# Patient Record
Sex: Male | Born: 1975 | Race: White | Hispanic: No | Marital: Married | Smoking: Never smoker
Health system: Southern US, Community
[De-identification: ages and names within clinical notes are randomized; demographics above are authoritative.]

## PROBLEM LIST (undated history)

## (undated) DIAGNOSIS — E7800 Pure hypercholesterolemia, unspecified: Secondary | ICD-10-CM

## (undated) DIAGNOSIS — J45909 Unspecified asthma, uncomplicated: Secondary | ICD-10-CM

## (undated) HISTORY — PX: WRIST FUSION: SHX839

## (undated) HISTORY — PX: ANKLE FRACTURE SURGERY: SHX122

## (undated) HISTORY — PX: RHINOPLASTY: SUR1284

---

## 2005-05-23 ENCOUNTER — Emergency Department (HOSPITAL_COMMUNITY): Admission: EM | Admit: 2005-05-23 | Discharge: 2005-05-23 | Payer: Self-pay | Admitting: Emergency Medicine

## 2010-03-01 ENCOUNTER — Ambulatory Visit: Payer: Self-pay | Admitting: Interventional Radiology

## 2010-03-01 ENCOUNTER — Emergency Department (HOSPITAL_BASED_OUTPATIENT_CLINIC_OR_DEPARTMENT_OTHER): Admission: EM | Admit: 2010-03-01 | Discharge: 2010-03-01 | Payer: Self-pay | Admitting: Emergency Medicine

## 2010-10-30 ENCOUNTER — Emergency Department (INDEPENDENT_AMBULATORY_CARE_PROVIDER_SITE_OTHER): Payer: BC Managed Care – PPO

## 2010-10-30 ENCOUNTER — Emergency Department (HOSPITAL_BASED_OUTPATIENT_CLINIC_OR_DEPARTMENT_OTHER)
Admission: EM | Admit: 2010-10-30 | Discharge: 2010-10-31 | Disposition: A | Discharge status: Home / Self Care | Payer: BC Managed Care – PPO | Attending: Emergency Medicine | Admitting: Emergency Medicine

## 2010-10-30 ENCOUNTER — Encounter: Payer: Self-pay | Admitting: Student

## 2010-10-30 DIAGNOSIS — S93409A Sprain of unspecified ligament of unspecified ankle, initial encounter: Secondary | ICD-10-CM | POA: Insufficient documentation

## 2010-10-30 DIAGNOSIS — E7800 Pure hypercholesterolemia, unspecified: Secondary | ICD-10-CM | POA: Insufficient documentation

## 2010-10-30 DIAGNOSIS — W19XXXA Unspecified fall, initial encounter: Secondary | ICD-10-CM | POA: Insufficient documentation

## 2010-10-30 DIAGNOSIS — M79609 Pain in unspecified limb: Secondary | ICD-10-CM

## 2010-10-30 DIAGNOSIS — Y9239 Other specified sports and athletic area as the place of occurrence of the external cause: Secondary | ICD-10-CM | POA: Insufficient documentation

## 2010-10-30 DIAGNOSIS — Y9366 Activity, soccer: Secondary | ICD-10-CM | POA: Insufficient documentation

## 2010-10-30 DIAGNOSIS — M25579 Pain in unspecified ankle and joints of unspecified foot: Secondary | ICD-10-CM

## 2010-10-30 HISTORY — DX: Pure hypercholesterolemia, unspecified: E78.00

## 2010-10-30 NOTE — ED Notes (Signed)
Pt in with c/o right foot and ankle pain x 1 week s/p soccer injury where he broke left arm. Area shows signs of swelling and dark purple/pink bruising. Pt able to ambulate and bear weight with pain. Moves digits well.

## 2010-10-30 NOTE — ED Provider Notes (Signed)
History     Chief Complaint  Patient presents with  . Foot Pain    Right Foot and ankle   Patient is a 35 y.o. male presenting with lower extremity pain. The history is provided by the patient (Was seen by [facility] Orthopedist after fall playing soccer.  L wrist in spling.  Now having R ankle pain and burising.  Is on Vicodin which is helping. ).  Foot Pain The current episode started more than 2 days ago. The problem occurs constantly. The problem has been gradually improving. Pertinent negatives include no chest pain, no abdominal pain, no headaches and no shortness of breath. The symptoms are aggravated by walking. The symptoms are relieved by rest. The treatment provided moderate relief.    Past Medical History  Diagnosis Date  . Hypercholesteremia     Past Surgical History  Procedure Date  . Rhinoplasty     No family history on file.  History  Substance Use Topics  . Smoking status: Never Smoker   . Smokeless tobacco: Never Used  . Alcohol Use: Yes      Review of Systems  Respiratory: Negative for shortness of breath.   Cardiovascular: Negative for chest pain.  Gastrointestinal: Negative for abdominal pain.  Neurological: Negative for headaches.  All other systems reviewed and are negative.    Physical Exam  BP 141/92  Pulse 85  Temp(Src) 98.2 F (36.8 C) (Oral)  Resp 20  SpO2 98%  Physical Exam  Constitutional: He is oriented to person, place, and time. He appears well-developed and well-nourished.  HENT:  Head: Normocephalic and atraumatic.  Eyes: Conjunctivae and EOM are normal. Pupils are equal, round, and reactive to light.  Neck: Neck supple.  Cardiovascular: Normal rate and regular rhythm.  Exam reveals no gallop and no friction rub.   No murmur heard. Pulmonary/Chest: Breath sounds normal. He has no wheezes. He has no rales. He exhibits no tenderness.  Abdominal: Soft. Bowel sounds are normal. He exhibits no distension. There is no tenderness. There  is no rebound and no guarding.  Musculoskeletal: Normal range of motion. He exhibits tenderness.       Bruising and tenderness over proximal R ankle.  No deformity.  Pulses intact.  No proximal tibia tenderness.   Neurological: He is alert and oriented to person, place, and time. A cranial nerve deficit is present.  Skin: Skin is warm and dry. No rash noted.  Psychiatric: He has a normal mood and affect.    ED Course  Procedures  MDM No results found for this or any previous visit. Dg Ankle Complete Right  10/30/2010  *RADIOLOGY REPORT*  Clinical Data: Pain post soccer  RIGHT ANKLE - COMPLETE 3+ VIEW  Comparison: None.  Findings: Ankle mortise intact. Negative for fracture, dislocation, or other acute abnormality.  Normal alignment and mineralization. No significant degenerative change.  Regional soft tissues unremarkable.  IMPRESSION:  Negative  Original Report Authenticated By: Osa Craver, M.D.   Dg Foot Complete Right  10/30/2010  *RADIOLOGY REPORT*  Clinical Data: Pain after soccer  RIGHT FOOT COMPLETE - 3+ VIEW  Comparison: None.  Findings: Negative for fracture, dislocation, or other acute abnormality.  Normal alignment and mineralization. No significant degenerative change.  Regional soft tissues unremarkable.  IMPRESSION:  Negative  Original Report Authenticated By: Osa Craver, M.D.    Pt is seen and examined;  Initial history and physical completed.  Will follow.      Pt will continue RICE.  Splint applied for sprain or possible occult FX.  Has ORTHO doc in [place].  DC home in stable condition.   Gwynneth Fabio A. Patrica Duel, MD 10/30/10 [phone]

## 2011-11-09 ENCOUNTER — Encounter (HOSPITAL_BASED_OUTPATIENT_CLINIC_OR_DEPARTMENT_OTHER): Payer: Self-pay | Admitting: *Deleted

## 2011-11-09 ENCOUNTER — Emergency Department (HOSPITAL_BASED_OUTPATIENT_CLINIC_OR_DEPARTMENT_OTHER)
Admission: EM | Admit: 2011-11-09 | Discharge: 2011-11-09 | Disposition: A | Discharge status: Home / Self Care | Payer: BC Managed Care – PPO | Attending: Emergency Medicine | Admitting: Emergency Medicine

## 2011-11-09 DIAGNOSIS — M795 Residual foreign body in soft tissue: Secondary | ICD-10-CM

## 2011-11-09 DIAGNOSIS — Z23 Encounter for immunization: Secondary | ICD-10-CM | POA: Insufficient documentation

## 2011-11-09 DIAGNOSIS — T169XXA Foreign body in ear, unspecified ear, initial encounter: Secondary | ICD-10-CM | POA: Insufficient documentation

## 2011-11-09 DIAGNOSIS — IMO0002 Reserved for concepts with insufficient information to code with codable children: Secondary | ICD-10-CM | POA: Insufficient documentation

## 2011-11-09 MED ORDER — TETANUS-DIPHTH-ACELL PERTUSSIS 5-2.5-18.5 LF-MCG/0.5 IM SUSP
0.5000 mL | Freq: Once | INTRAMUSCULAR | Status: AC
  Administered 2011-11-09: 0.5 mL via INTRAMUSCULAR
  Filled 2011-11-09: qty 0.5

## 2011-11-09 NOTE — ED Provider Notes (Signed)
History     CSN: [id number]  Arrival date & time 11/09/11  2203   First MD Initiated Contact with Patient 11/09/11 2328      Chief Complaint  Patient presents with  . Foreign Body in Ear    (Consider location/radiation/quality/duration/timing/severity/associated sxs/prior treatment) HPI Comments: Patient reports playing soccer earlier this evening when a moth flew in his ear and he could not get it out. He reports feeling the moth flutter every once in a while. He denies ear pain, tinnitus, headache, ear discharge, and hearing loss.   Patient is a 36 y.o. male presenting with foreign body in ear.  Foreign Body in Ear Pertinent negatives include no headaches.    Past Medical History  Diagnosis Date  . Hypercholesteremia     Past Surgical History  Procedure Date  . Rhinoplasty     No family history on file.  History  Substance Use Topics  . Smoking status: Never Smoker   . Smokeless tobacco: Never Used  . Alcohol Use: Yes      Review of Systems  HENT: Negative for hearing loss, ear pain, tinnitus and ear discharge.   Eyes: Negative for visual disturbance.  Neurological: Negative for dizziness, light-headedness and headaches.    Allergies  Crab  Home Medications   Current Outpatient Rx  Name Route Sig Dispense Refill  . EPINEPHRINE 0.3 MG/0.3ML IJ DEVI Intramuscular Inject 0.3 mg into the muscle once.    Marland Kitchen HYDROCODONE-ACETAMINOPHEN 5-500 MG PO TABS Oral Take 1 tablet by mouth 2 (two) times daily as needed. For pain     . MELOXICAM 15 MG PO TABS Oral Take 15 mg by mouth daily.    Marland Kitchen PHENTERMINE HCL 37.5 MG PO TABS Oral Take 37.5 mg by mouth daily before breakfast.    . SIMVASTATIN 20 MG PO TABS Oral Take 20 mg by mouth every evening.      BP 169/93  Pulse 102  Temp 98.5 F (36.9 C) (Oral)  Resp 18  Ht 5\' 10"  (1.778 m)  Wt 238 lb (107.956 kg)  BMI 34.15 kg/m2  SpO2 96%  Physical Exam  Constitutional: He appears well-developed and well-nourished. No  distress.  HENT:  Head: Normocephalic and atraumatic.       Visualization of left external ear canal shows an insect that is not moving.   Eyes: Conjunctivae are normal. No scleral icterus.  Neck: Normal range of motion.  Cardiovascular: Normal rate and regular rhythm.   Pulmonary/Chest: Effort normal.  Musculoskeletal: Normal range of motion.  Neurological: He is alert.  Skin: Skin is warm and dry. He is not diaphoretic.  Psychiatric: He has a normal mood and affect. His behavior is normal.    ED Course  FOREIGN BODY REMOVAL Date/Time: 11/09/2011 11:42 PM Performed by: Emilia Beck Authorized by: Emilia Beck Consent: Verbal consent obtained. Written consent not obtained. Risks and benefits: risks, benefits and alternatives were discussed Consent given by: patient Patient understanding: patient states understanding of the procedure being performed Patient consent: the patient's understanding of the procedure matches consent given Procedure consent: procedure consent matches procedure scheduled Relevant documents: relevant documents present and verified Test results: test results available and properly labeled Site marked: the operative site was marked Imaging studies: imaging studies available Required items: required blood products, implants, devices, and special equipment available Patient identity confirmed: verbally with patient Body area: ear Location details: left ear Patient sedated: no Patient restrained: no Patient cooperative: yes Localization method: visualized Removal mechanism: alligator forceps Complexity:  simple 1 objects recovered. Objects recovered: insect Post-procedure assessment: foreign body removed Patient tolerance: Patient tolerated the procedure well with no immediate complications. Comments: Insect visualized in the patient's left ear, removed with alligator forceps. I visualized an intact TM after the procedure as well as a clear  external left ear canal.    (including critical care time)  Labs Reviewed - No data to display No results found.   No diagnosis found.    MDM  11:45 PM Insect removed from patient's left external ear canal without complications. I visualized an intact left TM and clear external ear canal following the procedure. Patient will be given Tdap because he cannot remember his last Tetanus vaccine. No further evaluation at this time. Patient will be discharged. Plan discussed with Dr. Nicanor Alcon who is agreeable.        Emilia Beck, PA-C 11/09/11 [phone]

## 2011-11-09 NOTE — ED Notes (Signed)
Was playing soccer when an insect flew into his left ear. He can feel it moving.

## 2011-11-10 NOTE — ED Provider Notes (Signed)
Medical screening examination/treatment/procedure(s) were performed by non-physician practitioner and as supervising physician I was immediately available for consultation/collaboration.  Jasmine Awe, MD 11/10/11 [phone]

## 2012-08-10 ENCOUNTER — Emergency Department (HOSPITAL_BASED_OUTPATIENT_CLINIC_OR_DEPARTMENT_OTHER): Payer: BC Managed Care – PPO

## 2012-08-10 ENCOUNTER — Encounter (HOSPITAL_BASED_OUTPATIENT_CLINIC_OR_DEPARTMENT_OTHER): Payer: Self-pay | Admitting: *Deleted

## 2012-08-10 ENCOUNTER — Emergency Department (HOSPITAL_BASED_OUTPATIENT_CLINIC_OR_DEPARTMENT_OTHER)
Admission: EM | Admit: 2012-08-10 | Discharge: 2012-08-10 | Disposition: A | Discharge status: Home / Self Care | Payer: BC Managed Care – PPO | Attending: Emergency Medicine | Admitting: Emergency Medicine

## 2012-08-10 DIAGNOSIS — E7800 Pure hypercholesterolemia, unspecified: Secondary | ICD-10-CM | POA: Insufficient documentation

## 2012-08-10 DIAGNOSIS — S63509A Unspecified sprain of unspecified wrist, initial encounter: Secondary | ICD-10-CM | POA: Insufficient documentation

## 2012-08-10 DIAGNOSIS — S63501A Unspecified sprain of right wrist, initial encounter: Secondary | ICD-10-CM

## 2012-08-10 DIAGNOSIS — Y9239 Other specified sports and athletic area as the place of occurrence of the external cause: Secondary | ICD-10-CM | POA: Insufficient documentation

## 2012-08-10 DIAGNOSIS — Z79899 Other long term (current) drug therapy: Secondary | ICD-10-CM | POA: Insufficient documentation

## 2012-08-10 DIAGNOSIS — Y9366 Activity, soccer: Secondary | ICD-10-CM | POA: Insufficient documentation

## 2012-08-10 DIAGNOSIS — Y92838 Other recreation area as the place of occurrence of the external cause: Secondary | ICD-10-CM | POA: Insufficient documentation

## 2012-08-10 DIAGNOSIS — X500XXA Overexertion from strenuous movement or load, initial encounter: Secondary | ICD-10-CM | POA: Insufficient documentation

## 2012-08-10 DIAGNOSIS — Z791 Long term (current) use of non-steroidal anti-inflammatories (NSAID): Secondary | ICD-10-CM | POA: Insufficient documentation

## 2012-08-10 NOTE — ED Provider Notes (Signed)
History     CSN: [id number]  Arrival date & time 08/10/12  2156   First MD Initiated Contact with Patient 08/10/12 2239      Chief Complaint  Patient presents with  . Wrist Injury    (Consider location/radiation/quality/duration/timing/severity/associated sxs/prior treatment) Patient is a 37 y.o. male presenting with wrist injury. The history is provided by the patient.  Wrist Injury Location:  Wrist Injury: yes   Mechanism of injury comment:  Bent backward blocking soccer kick Wrist location:  R wrist Pain details:    Quality:  Aching   Radiates to:  Does not radiate   Severity:  Moderate   Onset quality:  Sudden   Timing:  Constant   Progression:  Unchanged Chronicity:  New Dislocation: no   Prior injury to area:  No Relieved by:  Nothing Worsened by:  Movement Ineffective treatments:  None tried   Past Medical History  Diagnosis Date  . Hypercholesteremia     Past Surgical History  Procedure Laterality Date  . Rhinoplasty      History reviewed. No pertinent family history.  History  Substance Use Topics  . Smoking status: Never Smoker   . Smokeless tobacco: Never Used  . Alcohol Use: No      Review of Systems  All other systems reviewed and are negative.    Allergies  Crab and Bee pollen  Home Medications   Current Outpatient Rx  Name  Route  Sig  Dispense  Refill  . EPINEPHrine (EPIPEN) 0.3 mg/0.3 mL DEVI   Intramuscular   Inject 0.3 mg into the muscle once.         Marland Kitchen HYDROcodone-acetaminophen (VICODIN) 5-500 MG per tablet   Oral   Take 1 tablet by mouth 2 (two) times daily as needed. For pain          . meloxicam (MOBIC) 15 MG tablet   Oral   Take 15 mg by mouth daily.         . phentermine (ADIPEX-P) 37.5 MG tablet   Oral   Take 37.5 mg by mouth daily before breakfast.         . simvastatin (ZOCOR) 20 MG tablet   Oral   Take 20 mg by mouth every evening.           BP 144/89  Pulse 70  Temp(Src) 98.5 F (36.9  C) (Oral)  Resp 18  Ht 5\' 10"  (1.778 m)  Wt 215 lb (97.523 kg)  BMI 30.85 kg/m2  SpO2 98%  Physical Exam  Nursing note and vitals reviewed. Constitutional: He is oriented to person, place, and time. He appears well-developed and well-nourished. No distress.  HENT:  Head: Normocephalic and atraumatic.  Neck: Normal range of motion. Neck supple.  Musculoskeletal: Normal range of motion.  The right wrist is noted to have ttp, mild swelling over the lateral aspect of the wrist.  The distal motor, sensory, and cap refill are intact.  Neurological: He is alert and oriented to person, place, and time.  Skin: Skin is warm and dry. He is not diaphoretic.    ED Course  Procedures (including critical care time)  Labs Reviewed - No data to display No results found.   No diagnosis found.    MDM  Will treat with rest, nsaids.  Follow up prn.          Geoffery Lyons, MD 08/10/12 2255

## 2012-08-10 NOTE — ED Notes (Signed)
Pt c/o right wrist and hand injury x 2 hrs ago while playing soccer

## 2012-09-11 ENCOUNTER — Emergency Department (HOSPITAL_BASED_OUTPATIENT_CLINIC_OR_DEPARTMENT_OTHER)
Admission: EM | Admit: 2012-09-11 | Discharge: 2012-09-11 | Disposition: A | Discharge status: Home / Self Care | Payer: BC Managed Care – PPO | Attending: Emergency Medicine | Admitting: Emergency Medicine

## 2012-09-11 ENCOUNTER — Encounter (HOSPITAL_BASED_OUTPATIENT_CLINIC_OR_DEPARTMENT_OTHER): Payer: Self-pay | Admitting: Emergency Medicine

## 2012-09-11 ENCOUNTER — Emergency Department (HOSPITAL_BASED_OUTPATIENT_CLINIC_OR_DEPARTMENT_OTHER): Payer: BC Managed Care – PPO

## 2012-09-11 DIAGNOSIS — N419 Inflammatory disease of prostate, unspecified: Secondary | ICD-10-CM | POA: Insufficient documentation

## 2012-09-11 DIAGNOSIS — Z8639 Personal history of other endocrine, nutritional and metabolic disease: Secondary | ICD-10-CM | POA: Insufficient documentation

## 2012-09-11 DIAGNOSIS — R21 Rash and other nonspecific skin eruption: Secondary | ICD-10-CM | POA: Insufficient documentation

## 2012-09-11 DIAGNOSIS — Z862 Personal history of diseases of the blood and blood-forming organs and certain disorders involving the immune mechanism: Secondary | ICD-10-CM | POA: Insufficient documentation

## 2012-09-11 DIAGNOSIS — R509 Fever, unspecified: Secondary | ICD-10-CM | POA: Insufficient documentation

## 2012-09-11 DIAGNOSIS — M549 Dorsalgia, unspecified: Secondary | ICD-10-CM | POA: Insufficient documentation

## 2012-09-11 HISTORY — DX: Unspecified asthma, uncomplicated: J45.909

## 2012-09-11 LAB — CBC WITH DIFFERENTIAL/PLATELET
Eosinophils Absolute: 0.2 10*3/uL (ref 0.0–0.7)
Eosinophils Relative: 2 % (ref 0–5)
Hemoglobin: 15.2 g/dL (ref 13.0–17.0)
Lymphs Abs: 3 10*3/uL (ref 0.7–4.0)
MCH: 30.2 pg (ref 26.0–34.0)
MCV: 85.1 fL (ref 78.0–100.0)
Monocytes Absolute: 1 10*3/uL (ref 0.1–1.0)
Monocytes Relative: 10 % (ref 3–12)
RBC: 5.03 MIL/uL (ref 4.22–5.81)

## 2012-09-11 LAB — URINALYSIS, ROUTINE W REFLEX MICROSCOPIC
Bilirubin Urine: NEGATIVE
Hgb urine dipstick: NEGATIVE
Ketones, ur: NEGATIVE mg/dL
Specific Gravity, Urine: 1.029 (ref 1.005–1.030)
pH: 5.5 (ref 5.0–8.0)

## 2012-09-11 LAB — LIPASE, BLOOD: Lipase: 41 U/L (ref 11–59)

## 2012-09-11 LAB — COMPREHENSIVE METABOLIC PANEL
Alkaline Phosphatase: 73 U/L (ref 39–117)
BUN: 17 mg/dL (ref 6–23)
Calcium: 9.3 mg/dL (ref 8.4–10.5)
Creatinine, Ser: 0.8 mg/dL (ref 0.50–1.35)
GFR calc Af Amer: >90 mL/min (ref >90)
Glucose, Bld: 98 mg/dL (ref 70–99)
Total Protein: 7 g/dL (ref 6.0–8.3)

## 2012-09-11 MED ORDER — HYDROCODONE-ACETAMINOPHEN 5-325 MG PO TABS: 1.0000 | ORAL_TABLET | ORAL | Status: DC | PRN

## 2012-09-11 MED ORDER — IOHEXOL 300 MG/ML  SOLN
50.0000 mL | Freq: Once | INTRAMUSCULAR | Status: AC | PRN
  Administered 2012-09-11: 100 mL via INTRAVENOUS

## 2012-09-11 MED ORDER — IOHEXOL 300 MG/ML  SOLN
50.0000 mL | Freq: Once | INTRAMUSCULAR | Status: AC | PRN
  Administered 2012-09-11: 50 mL via ORAL

## 2012-09-11 MED ORDER — CEFDINIR 300 MG PO CAPS: 300.0000 mg | ORAL_CAPSULE | Freq: Two times a day (BID) | ORAL | Status: DC

## 2012-09-11 MED ORDER — DEXTROSE 5 % IV SOLN
1.0000 g | Freq: Once | INTRAVENOUS | Status: AC
  Administered 2012-09-11: 1 g via INTRAVENOUS
  Filled 2012-09-11: qty 10

## 2012-09-11 NOTE — ED Provider Notes (Addendum)
History  This chart was scribed for Gilda Crease, MD by Ardelia Mems, ED Scribe. This patient was seen in room [facility]11/[facility]11 and the patient's care was started at 6:33 PM.   CSN: [id number]  Arrival date & time 09/11/12  1723     Chief Complaint  Patient presents with  . Flank Pain  . Abdominal Pain     Patient is a 37 y.o. male presenting with abdominal pain. The history is provided by the patient. No language interpreter was used.  Abdominal Pain Associated symptoms include abdominal pain.    HPI Comments: Noah Rice is a 37 y.o. male who presents to the Emergency Department complaining of sudden onset, sharp right flank pain, which radiates to his back onset yesterday. Pt states that he began having constant, moderate RLQ abdominal pain 1 hour ago after a prostate exam. Pt states that he went to see his MD today with a fever of 102F in addition to his flank pain, and that when his abdominal pain onset suddenly, pt was sent here to rule out appendicitis.Triage Temp is 98.79F. Pt's RLQ abdominal pain is worse with palpation. Pt states that he has also has a rash on LLQ for 6 days, that is not spreading. Pt denies chills, diarrhea, vomiting or any other symptoms.   Past Medical History  Diagnosis Date  . Hypercholesteremia     Past Surgical History  Procedure Laterality Date  . Rhinoplasty      No family history on file.  History  Substance Use Topics  . Smoking status: Never Smoker   . Smokeless tobacco: Never Used  . Alcohol Use: No      Review of Systems  Constitutional: Positive for fever. Negative for chills.  HENT: Negative for neck pain.   Gastrointestinal: Positive for abdominal pain. Negative for nausea, vomiting and diarrhea.       Flank pain.  Musculoskeletal: Positive for back pain.  Skin: Positive for rash.   A complete 10 system review of systems was obtained and all systems are negative except as noted in the HPI and PMH.   Allergies  Crab  and Bee pollen  Home Medications   Current Outpatient Rx  Name  Route  Sig  Dispense  Refill  . EPINEPHrine (EPIPEN) 0.3 mg/0.3 mL DEVI   Intramuscular   Inject 0.3 mg into the muscle once.         . phentermine (ADIPEX-P) 37.5 MG tablet   Oral   Take 37.5 mg by mouth daily before breakfast.           Triage Vitals: BP 135/83  Pulse 68  Temp(Src) 98.8 F (37.1 C) (Oral)  Resp 16  Ht 5\' 10"  (1.778 m)  Wt 217 lb (98.431 kg)  BMI 31.14 kg/m2  SpO2 98%  Physical Exam  Constitutional: He is oriented to person, place, and time. He appears well-developed and well-nourished. No distress.  HENT:  Head: Normocephalic and atraumatic.  Right Ear: Hearing normal.  Left Ear: Hearing normal.  Nose: Nose normal.  Mouth/Throat: Oropharynx is clear and moist and mucous membranes are normal.  Eyes: Conjunctivae and EOM are normal. Pupils are equal, round, and reactive to light.  Neck: Normal range of motion. Neck supple.  Cardiovascular: Regular rhythm, S1 normal and S2 normal.  Exam reveals no gallop and no friction rub.   No murmur heard. Pulmonary/Chest: Effort normal and breath sounds normal. No respiratory distress. He exhibits no tenderness.  Abdominal: Soft. Normal appearance and bowel sounds  are normal. There is no hepatosplenomegaly. There is tenderness in the right lower quadrant. There is no rebound, no guarding, no tenderness at McBurney's point and negative Murphy's sign. No hernia.  Musculoskeletal: Normal range of motion.  Neurological: He is alert and oriented to person, place, and time. He has normal strength. No cranial nerve deficit or sensory deficit. Coordination normal. GCS eye subscore is 4. GCS verbal subscore is 5. GCS motor subscore is 6.  Skin: Skin is warm, dry and intact. No rash noted. No cyanosis.  Psychiatric: He has a normal mood and affect. His speech is normal and behavior is normal. Thought content normal.    ED Course  Procedures (including  critical care time)  DIAGNOSTIC STUDIES: Oxygen Saturation is 98% on RA, normal by my interpretation.    COORDINATION OF CARE: 6:36 PM- Pt advised of plan for treatment and pt agrees.  Medications  iohexol (OMNIPAQUE) 300 MG/ML solution 50 mL (not administered)  iohexol (OMNIPAQUE) 300 MG/ML solution 50 mL (50 mLs Oral Contrast Given 09/11/12 1930)     Labs Reviewed  COMPREHENSIVE METABOLIC PANEL - Abnormal; Notable for the following:    Total Bilirubin 0.2 (*)    All other components within normal limits  URINALYSIS, ROUTINE W REFLEX MICROSCOPIC  CBC WITH DIFFERENTIAL  LIPASE, BLOOD   Ct Abdomen Pelvis W Contrast  09/11/2012   *RADIOLOGY REPORT*  Clinical Data: Appendicitis.  Abdominal pain.  Right lower quadrant pain.  CT ABDOMEN AND PELVIS WITH CONTRAST  Technique:  Multidetector CT imaging of the abdomen and pelvis was performed following the standard protocol during bolus administration of intravenous contrast.  Contrast: OMNIPAQUE IOHEXOL 300 MG/ML  SOLN  Comparison: None.  Findings: Lung Bases: Atelectasis at the bases.  Suggestion of centrilobular emphysema.  Liver:  Normal.  Spleen:  Normal.  Gallbladder:  Normal.  Common bile duct:  Normal.  Pancreas:  Normal.  Adrenal glands:  Normal bilaterally.  Kidneys:  Normal enhancement.  Both ureters are normal.  No calculi.  Stomach:  Normal.  Small bowel:  Normal.  No inflammatory changes of mesenteric adenopathy.  Colon:   Normal appendix.  Remainder of the colon is normal.  Pelvic Genitourinary:  Urinary bladder normal.  No free fluid.  No adenopathy.  Bones:  Small calcified protrusion at L5-S1.  Vasculature: Normal.  Body Wall: Normal.  IMPRESSION: Negative for appendicitis.  No acute abnormality.   Original Report Authenticated By: Andreas Newport, M.D.     Diagnosis: Prostatitis    MDM  Patient sent to the ER for evaluation of possible appendicitis. Patient has been experiencing fever and right lower abdominal pain. Patient  has been experiencing a fever for several days. Was seen by his doctor earlier and it was felt that he had an enlarged tender prostate, possibly prostate infection. After the prostate exam, however, they started having pain in the right lower quadrant and he was told to come to the ER to get a CAT scan to rule out appendicitis. CAT scan has been performed and is negative. Based on the history of pain prostate exam, concern for prostatitis is reasonable. Patient is leaving the country tomorrow therefore will be aggressively treated with Rocephin here and prescribed Omnicef.      I personally performed the services described in this documentation, which was scribed in my presence. The recorded information has been reviewed and is accurate.   Gilda Crease, MD 09/11/12 [id number]  Gilda Crease, MD 09/11/12 2056

## 2012-09-11 NOTE — ED Notes (Signed)
rx x 2 for omnicef and hydrocodone- d/c with ride

## 2012-09-11 NOTE — ED Notes (Signed)
Pt having right flank pain, radiating to back.  Also having RLQ abdominal pain.  Pt relates he saw his MD today.  Ran blood work and was sent here for evaluation to rule out appendix.

## 2012-12-11 ENCOUNTER — Encounter (HOSPITAL_BASED_OUTPATIENT_CLINIC_OR_DEPARTMENT_OTHER): Payer: Self-pay | Admitting: Emergency Medicine

## 2012-12-11 ENCOUNTER — Emergency Department (HOSPITAL_BASED_OUTPATIENT_CLINIC_OR_DEPARTMENT_OTHER): Payer: BC Managed Care – PPO

## 2012-12-11 ENCOUNTER — Emergency Department (HOSPITAL_BASED_OUTPATIENT_CLINIC_OR_DEPARTMENT_OTHER)
Admission: EM | Admit: 2012-12-11 | Discharge: 2012-12-11 | Disposition: A | Discharge status: Other Acute Inpatient Hospital | Payer: BC Managed Care – PPO | Attending: Emergency Medicine | Admitting: Emergency Medicine

## 2012-12-11 DIAGNOSIS — I200 Unstable angina: Secondary | ICD-10-CM

## 2012-12-11 DIAGNOSIS — Z862 Personal history of diseases of the blood and blood-forming organs and certain disorders involving the immune mechanism: Secondary | ICD-10-CM | POA: Insufficient documentation

## 2012-12-11 DIAGNOSIS — Z8639 Personal history of other endocrine, nutritional and metabolic disease: Secondary | ICD-10-CM | POA: Insufficient documentation

## 2012-12-11 DIAGNOSIS — J45909 Unspecified asthma, uncomplicated: Secondary | ICD-10-CM | POA: Insufficient documentation

## 2012-12-11 DIAGNOSIS — R209 Unspecified disturbances of skin sensation: Secondary | ICD-10-CM | POA: Insufficient documentation

## 2012-12-11 DIAGNOSIS — I209 Angina pectoris, unspecified: Secondary | ICD-10-CM | POA: Insufficient documentation

## 2012-12-11 DIAGNOSIS — Z79899 Other long term (current) drug therapy: Secondary | ICD-10-CM | POA: Insufficient documentation

## 2012-12-11 LAB — CBC WITH DIFFERENTIAL/PLATELET
Basophils Relative: 0 % (ref 0–1)
HCT: 45.1 % (ref 39.0–52.0)
Hemoglobin: 15.5 g/dL (ref 13.0–17.0)
Lymphs Abs: 3.1 10*3/uL (ref 0.7–4.0)
MCH: 29.7 pg (ref 26.0–34.0)
MCHC: 34.4 g/dL (ref 30.0–36.0)
Monocytes Absolute: 1 10*3/uL (ref 0.1–1.0)
Monocytes Relative: 10 % (ref 3–12)
Neutro Abs: 5.9 10*3/uL (ref 1.7–7.7)
RBC: 5.22 MIL/uL (ref 4.22–5.81)

## 2012-12-11 LAB — BASIC METABOLIC PANEL
BUN: 17 mg/dL (ref 6–23)
Chloride: 100 mEq/L (ref 96–112)
GFR calc Af Amer: >90 mL/min (ref >90)
Glucose, Bld: 97 mg/dL (ref 70–99)
Potassium: 3.9 mEq/L (ref 3.5–5.1)

## 2012-12-11 MED ORDER — SODIUM CHLORIDE 0.9 % IV SOLN
Freq: Once | INTRAVENOUS | Status: AC
  Administered 2012-12-11: 20 mL/h via INTRAVENOUS

## 2012-12-11 MED ORDER — ASPIRIN 81 MG PO CHEW
324.0000 mg | CHEWABLE_TABLET | Freq: Once | ORAL | Status: AC
  Administered 2012-12-11: 324 mg via ORAL
  Filled 2012-12-11: qty 3
  Filled 2012-12-11: qty 1

## 2012-12-11 MED ORDER — NITROGLYCERIN 0.4 MG SL SUBL
0.4000 mg | SUBLINGUAL_TABLET | SUBLINGUAL | Status: DC | PRN
  Filled 2012-12-11: qty 25

## 2012-12-11 MED ORDER — ENOXAPARIN SODIUM 100 MG/ML ~~LOC~~ SOLN
1.0000 mg/kg | Freq: Once | SUBCUTANEOUS | Status: AC
  Administered 2012-12-11: 100 mg via SUBCUTANEOUS
  Filled 2012-12-11: qty 1

## 2012-12-11 MED ORDER — NITROGLYCERIN 2 % TD OINT
TOPICAL_OINTMENT | TRANSDERMAL | Status: AC
  Filled 2012-12-11: qty 1

## 2012-12-11 MED ORDER — NITROGLYCERIN IN D5W 200-5 MCG/ML-% IV SOLN
2.0000 ug/min | INTRAVENOUS | Status: DC
  Administered 2012-12-11: 10 ug/min via INTRAVENOUS
  Filled 2012-12-11: qty 250

## 2012-12-11 MED ORDER — NITROGLYCERIN 2 % TD OINT
1.0000 [in_us] | TOPICAL_OINTMENT | Freq: Once | TRANSDERMAL | Status: AC
  Administered 2012-12-11: 1 [in_us] via TOPICAL

## 2012-12-11 NOTE — ED Notes (Signed)
Pt verbalizes no prior history of HTN

## 2012-12-11 NOTE — ED Notes (Signed)
Pt having left sided chest pain and left arm pain/numbness since this am.  Pt denies sob but does have some lightheadedness and fatigue.  Ekg done in triage.

## 2012-12-11 NOTE — ED Notes (Signed)
[facility] has assumed care of the patient.

## 2012-12-11 NOTE — ED Notes (Signed)
Note patient and his wife anxious....Marland Kitchenreassurred them, explained plan of care.

## 2012-12-11 NOTE — ED Notes (Signed)
Monitored rhythm is NSR, Rate 77, regular.

## 2012-12-11 NOTE — ED Notes (Signed)
Assigned to bed 727 @ [facility] per nursing supervisor, RN notified, [facility] called for transport.

## 2012-12-11 NOTE — ED Notes (Signed)
BP 139/71.  States 'pain' has resolved at this time, however c/o residual heaviness.  Monitoring closely.

## 2012-12-11 NOTE — ED Notes (Signed)
Bed assignment changed to 464 @ [facility] per nursing supervisor Tammy.

## 2012-12-11 NOTE — ED Provider Notes (Signed)
CSN: [id number]     Arrival date & time 12/11/12  1710 History  This chart was scribed for Doug Sou, MD by Caryn Bee, ED Scribe. This patient was seen in room MH07/MH07 and the patient's care was started 6:26 PM.    Chief Complaint  Patient presents with  . Chest Pain  . Numbness   The history is provided by the patient. No language interpreter was used.   HPI Comments: [place] is a 37 y.o. male who presents to the Emergency Department complaining of intermittent bilateral chest pain that began this morning when he was exercising on the elliptical. He reports the episodes last about 30-45 sec at a time. Pt states that when he woke up this morning he had some intermittent numbness in his left arm. He usually works out about 6 times per week, but this morning he was unable to work out as intensely as usual. He denies SOB currently, but had some about 1 hour ago.  Pt took prescribed androgel this morning. He denies taking any OTC medication for the chest pain. Pt also denies diaphoresis, nausea. Pt denies h/o HTN or DM. He has h/o hypercholesterolemia that was controlled with medication about 3-4 months ago. He denies allergies to any medications. Pt's father had a MI at age of 27 and also had hypercholesterolemia.   Pt's PCP is Dr. Verner Chol. Pt denies smoking but uses alcohol about 2-3 times per week. He does not use illicit drugs. Past Medical History  Diagnosis Date  . Hypercholesteremia   . Asthma    Past Surgical History  Procedure Laterality Date  . Rhinoplasty     History reviewed. No pertinent family history. History  Substance Use Topics  . Smoking status: Never Smoker   . Smokeless tobacco: Never Used  . Alcohol Use: No    Review of Systems  Constitutional: Negative.  Negative for diaphoresis.  HENT: Negative.   Respiratory: Positive for shortness of breath.   Cardiovascular: Positive for chest pain (bilateral chest pain).  Gastrointestinal: Negative.  Negative  for nausea.  Musculoskeletal: Negative.   Skin: Negative.   Neurological: Positive for numbness (intermittent left arm).  Psychiatric/Behavioral: Negative.   All other systems reviewed and are negative.    Allergies  Crab and Bee pollen  Home Medications   Current Outpatient Rx  Name  Route  Sig  Dispense  Refill  . Lorcaserin HCl (BELVIQ) 10 MG TABS   Oral   Take by mouth.         . testosterone (ANDROGEL) 50 MG/5GM GEL   Transdermal   Place 5 g onto the skin daily.         Marland Kitchen EPINEPHrine (EPIPEN) 0.3 mg/0.3 mL DEVI   Intramuscular   Inject 0.3 mg into the muscle once.          BP 169/77  Pulse 73  Temp(Src) 98.4 F (36.9 C) (Oral)  Resp 21  Ht 5\' 11"  (1.803 m)  Wt 225 lb (102.059 kg)  BMI 31.39 kg/m2  SpO2 99% Physical Exam  Nursing note and vitals reviewed. Constitutional: He appears well-developed and well-nourished.  HENT:  Head: Normocephalic and atraumatic.  Eyes: Conjunctivae are normal. Pupils are equal, round, and reactive to light.  Neck: Neck supple. No tracheal deviation present. No thyromegaly present.  Cardiovascular: Normal rate, regular rhythm and normal heart sounds.   No murmur heard. Pulmonary/Chest: Effort normal and breath sounds normal. He has no wheezes. He has no rales.  Abdominal: Soft. Bowel  sounds are normal. He exhibits no distension. There is no tenderness.  Musculoskeletal: Normal range of motion. He exhibits no edema and no tenderness.  Neurological: He is alert. Coordination normal.  Skin: Skin is warm and dry. No rash noted.  Psychiatric: He has a normal mood and affect.    ED Course  Procedures (including critical care time) DIAGNOSTIC STUDIES: Oxygen Saturation is 99% on room air, normal by my interpretation.    COORDINATION OF CARE: 6:34 PM-Discussed treatment plan with pt at bedside and pt agreed to plan.   Labs Review Labs Reviewed  BASIC METABOLIC PANEL  CBC WITH DIFFERENTIAL  TROPONIN I   Imaging  Review Dg Chest Portable 1 View  12/11/2012   *RADIOLOGY REPORT*  Clinical Data: Chest pain and numbness  PORTABLE CHEST - 1 VIEW  Comparison: None.  Findings: The heart size and mediastinal contours are within normal limits.  Both lungs are clear.  The visualized skeletal structures are unremarkable.  IMPRESSION: Negative exam.   Original Report Authenticated By: Signa Kell, M.D.   Nitroglycerin paste was initially placed on the patient. At 7:50 PM he continued to complain of mild discomfort. Intravenous nitroglycerin drip ordered to titrate to pain and Lovenox ordered subcutaneously.  A 40 5 PM patient pain-free after treatment with intravenous nitroglycerin and Lovenox. Chest x-ray viewed by me Results for orders placed during the hospital encounter of 12/11/12  BASIC METABOLIC PANEL      Result Value Range   Sodium 137  135 - 145 mEq/L   Potassium 3.9  3.5 - 5.1 mEq/L   Chloride 100  96 - 112 mEq/L   CO2 27  19 - 32 mEq/L   Glucose, Bld 97  70 - 99 mg/dL   BUN 17  6 - 23 mg/dL   Creatinine, Ser 1.61  0.50 - 1.35 mg/dL   Calcium 9.3  8.4 - 09.6 mg/dL   GFR calc non Af Amer >90  >90 mL/min   GFR calc Af Amer >90  >90 mL/min  CBC WITH DIFFERENTIAL      Result Value Range   WBC 10.3  4.0 - 10.5 K/uL   RBC 5.22  4.22 - 5.81 MIL/uL   Hemoglobin 15.5  13.0 - 17.0 g/dL   HCT 04.5  40.9 - 81.1 %   MCV 86.4  78.0 - 100.0 fL   MCH 29.7  26.0 - 34.0 pg   MCHC 34.4  30.0 - 36.0 g/dL   RDW 91.4  78.2 - 95.6 %   Platelets 239  150 - 400 K/uL   Neutrophils Relative % 57  43 - 77 %   Neutro Abs 5.9  1.7 - 7.7 K/uL   Lymphocytes Relative 31  12 - 46 %   Lymphs Abs 3.1  0.7 - 4.0 K/uL   Monocytes Relative 10  3 - 12 %   Monocytes Absolute 1.0  0.1 - 1.0 K/uL   Eosinophils Relative 2  0 - 5 %   Eosinophils Absolute 0.2  0.0 - 0.7 K/uL   Basophils Relative 0  0 - 1 %   Basophils Absolute 0.0  0.0 - 0.1 K/uL  TROPONIN I      Result Value Range   Troponin I <0.30  <0.30 ng/mL   Dg Chest  Portable 1 View  12/11/2012   *RADIOLOGY REPORT*  Clinical Data: Chest pain and numbness  PORTABLE CHEST - 1 VIEW  Comparison: None.  Findings: The heart size and mediastinal contours are within  normal limits.  Both lungs are clear.  The visualized skeletal structures are unremarkable.  IMPRESSION: Negative exam.   Original Report Authenticated By: Signa Kell, M.D.    MDM  No diagnosis found. Patient with history and risk factors consistent with unstable angina. I spoke with Dr. Eden Emms, [facility] who accepts patient in transfer Diagnosis unstable angina  CRITICAL CARE Performed by: Doug Sou Total critical care time: 30 minute Critical care time was exclusive of separately billable procedures and treating other patients. Critical care was necessary to treat or prevent imminent or life-threatening deterioration. Critical care was time spent personally by me on the following activities: development of treatment plan with patient and/or surrogate as well as nursing, discussions with consultants, evaluation of patient's response to treatment, examination of patient, obtaining history from patient or surrogate, ordering and performing treatments and interventions, ordering and review of laboratory studies, ordering and review of radiographic studies, pulse oximetry and re-evaluation of patient's condition.   Doug Sou, MD 12/11/12 2050

## 2012-12-11 NOTE — ED Notes (Signed)
Explained SL NTG to patient and its use as a prn medication for chest pain.  They will notify me if he feels that he needs it.

## 2013-03-08 ENCOUNTER — Encounter (HOSPITAL_BASED_OUTPATIENT_CLINIC_OR_DEPARTMENT_OTHER): Payer: Self-pay | Admitting: Emergency Medicine

## 2013-03-08 ENCOUNTER — Emergency Department (HOSPITAL_BASED_OUTPATIENT_CLINIC_OR_DEPARTMENT_OTHER)
Admission: EM | Admit: 2013-03-08 | Discharge: 2013-03-09 | Disposition: A | Discharge status: Home / Self Care | Payer: BC Managed Care – PPO | Attending: Emergency Medicine | Admitting: Emergency Medicine

## 2013-03-08 ENCOUNTER — Emergency Department (HOSPITAL_BASED_OUTPATIENT_CLINIC_OR_DEPARTMENT_OTHER): Payer: BC Managed Care – PPO

## 2013-03-08 DIAGNOSIS — X500XXA Overexertion from strenuous movement or load, initial encounter: Secondary | ICD-10-CM | POA: Insufficient documentation

## 2013-03-08 DIAGNOSIS — Z79899 Other long term (current) drug therapy: Secondary | ICD-10-CM | POA: Insufficient documentation

## 2013-03-08 DIAGNOSIS — Y9367 Activity, basketball: Secondary | ICD-10-CM | POA: Insufficient documentation

## 2013-03-08 DIAGNOSIS — Y9239 Other specified sports and athletic area as the place of occurrence of the external cause: Secondary | ICD-10-CM | POA: Insufficient documentation

## 2013-03-08 DIAGNOSIS — S92109A Unspecified fracture of unspecified talus, initial encounter for closed fracture: Secondary | ICD-10-CM | POA: Insufficient documentation

## 2013-03-08 DIAGNOSIS — Z8639 Personal history of other endocrine, nutritional and metabolic disease: Secondary | ICD-10-CM | POA: Insufficient documentation

## 2013-03-08 DIAGNOSIS — Z862 Personal history of diseases of the blood and blood-forming organs and certain disorders involving the immune mechanism: Secondary | ICD-10-CM | POA: Insufficient documentation

## 2013-03-08 DIAGNOSIS — S92101A Unspecified fracture of right talus, initial encounter for closed fracture: Secondary | ICD-10-CM

## 2013-03-08 DIAGNOSIS — J45909 Unspecified asthma, uncomplicated: Secondary | ICD-10-CM | POA: Insufficient documentation

## 2013-03-08 MED ORDER — OXYCODONE HCL 5 MG PO TABS
5.0000 mg | ORAL_TABLET | Freq: Once | ORAL | Status: AC
Start: 2013-03-09 — End: 2013-03-08
  Administered 2013-03-08: 5 mg via ORAL
  Filled 2013-03-08: qty 1

## 2013-03-08 NOTE — ED Notes (Signed)
Pt complains of rolling right ankle while playing basketball.  Pt took 2 extra strength tylenol and 2 aleve at 9pm

## 2013-03-08 NOTE — ED Provider Notes (Signed)
CSN: [id number]     Arrival date & time 03/08/13  2215 History  This chart was scribed for Noah B. Bernette Mayers, MD by Danella Maiers, ED Scribe. This patient was seen in room MH10/MH10 and the patient's care was started at 10:56 PM.    Chief Complaint  Patient presents with  . Ankle Pain   The history is provided by the patient. No language interpreter was used.   HPI Comments: Mccle[place]an Noah Rice is a 37 y.o. male who presents to the Emergency Department complaining of constant right ankle pain since rolling his right ankle while playing basketball around 8:30pm. Pt states he took 2 extra strength tylenol and 2 aleve at 9pm. He denies numbness. He has a h/o a torn ligament in the left foot.   Past Medical History  Diagnosis Date  . Hypercholesteremia   . Asthma    Past Surgical History  Procedure Laterality Date  . Rhinoplasty    . Wrist fusion Left   . Ankle fracture surgery Left    No family history on file. History  Substance Use Topics  . Smoking status: Never Smoker   . Smokeless tobacco: Never Used  . Alcohol Use: Yes     Comment: weekly    Review of Systems  Musculoskeletal: Positive for arthralgias (right ankle).  A complete 10 system review of systems was obtained and all systems are negative except as noted in the HPI and PMH.   Allergies  Crab and Bee pollen  Home Medications   Current Outpatient Rx  Name  Route  Sig  Dispense  Refill  . EPINEPHrine (EPIPEN) 0.3 mg/0.3 mL DEVI   Intramuscular   Inject 0.3 mg into the muscle once.         . Lorcaserin HCl (BELVIQ) 10 MG TABS   Oral   Take by mouth.         . testosterone (ANDROGEL) 50 MG/5GM GEL   Transdermal   Place 5 g onto the skin daily.          BP 152/90  Pulse 106  Temp(Src) 99 F (37.2 C) (Oral)  Resp 18  Ht 5\' 10"  (1.778 m)  Wt 230 lb (104.327 kg)  BMI 33.00 kg/m2  SpO2 96% Physical Exam  Constitutional: He is oriented to person, place, and time. He appears well-developed and  well-nourished.  HENT:  Head: Normocephalic and atraumatic.  Neck: Neck supple.  Pulmonary/Chest: Effort normal.  Musculoskeletal:  Swelling and ecchymosis to the lateral right ankle and foot. Tender over the lateral malleolus. non-tender on the medial malleolus or base of fifth metatarsal.  Neurological: He is alert and oriented to person, place, and time. No cranial nerve deficit.  Psychiatric: He has a normal mood and affect. His behavior is normal.    ED Course  Procedures (including critical care time) Medications - No data to display  DIAGNOSTIC STUDIES: Oxygen Saturation is 96% on RA, normal by my interpretation.    COORDINATION OF CARE: 11:44 PM- Discussed treatment plan with pt. Pt agrees to plan.    Labs Review Labs Reviewed - No data to display Imaging Review Dg Ankle Complete Right  03/08/2013   CLINICAL DATA:  Right ankle injury  EXAM: RIGHT ANKLE - COMPLETE 3+ VIEW  COMPARISON:  Prior radiograph from 10/30/2010.  FINDINGS: Diffuse soft tissue swelling is present at the lateral malleolus. The subtle thin linear lucency is seen traversing the lateral process of the talus, suspicious for an acute nondisplaced fracture. The ankle mortise  is approximated. Talar dome is intact. Joint effusion is noted.  IMPRESSION: 1. Subtle linear lucency traversing the lateral process of the talus, suspicious for acute nondisplaced fracture. 2. Diffuse soft tissue swelling at the lateral malleolus.   Electronically Signed   By: Rise Mu M.D.   On: 03/08/2013 23:46    EKG Interpretation   None       MDM   1. Talus fracture, right, closed, initial encounter     Pt has his own cam-walker and crutches from prior injury. He was fitted for same. Advised followup with his orthopedist for abnormality seen on xray. Pain meds as needed.     I personally performed the services described in this documentation, which was scribed in my presence. The recorded information has been  reviewed and is accurate.      Noah B. Bernette Mayers, MD 03/09/13 1610

## 2013-03-09 MED ORDER — HYDROCODONE-ACETAMINOPHEN 5-325 MG PO TABS: 2.0000 | ORAL_TABLET | Freq: Four times a day (QID) | ORAL | Status: DC | PRN

## 2015-10-20 ENCOUNTER — Emergency Department (HOSPITAL_BASED_OUTPATIENT_CLINIC_OR_DEPARTMENT_OTHER): Payer: BLUE CROSS/BLUE SHIELD

## 2015-10-20 ENCOUNTER — Emergency Department (HOSPITAL_BASED_OUTPATIENT_CLINIC_OR_DEPARTMENT_OTHER)
Admission: EM | Admit: 2015-10-20 | Discharge: 2015-10-20 | Disposition: A | Discharge status: Home / Self Care | Payer: BLUE CROSS/BLUE SHIELD | Attending: Emergency Medicine | Admitting: Emergency Medicine

## 2015-10-20 ENCOUNTER — Encounter (HOSPITAL_BASED_OUTPATIENT_CLINIC_OR_DEPARTMENT_OTHER): Payer: Self-pay | Admitting: *Deleted

## 2015-10-20 DIAGNOSIS — M79605 Pain in left leg: Secondary | ICD-10-CM

## 2015-10-20 DIAGNOSIS — S80862A Insect bite (nonvenomous), left lower leg, initial encounter: Secondary | ICD-10-CM | POA: Diagnosis not present

## 2015-10-20 DIAGNOSIS — Y9389 Activity, other specified: Secondary | ICD-10-CM | POA: Insufficient documentation

## 2015-10-20 DIAGNOSIS — Y999 Unspecified external cause status: Secondary | ICD-10-CM | POA: Diagnosis not present

## 2015-10-20 DIAGNOSIS — J45909 Unspecified asthma, uncomplicated: Secondary | ICD-10-CM | POA: Diagnosis not present

## 2015-10-20 DIAGNOSIS — Y929 Unspecified place or not applicable: Secondary | ICD-10-CM | POA: Insufficient documentation

## 2015-10-20 DIAGNOSIS — W57XXXA Bitten or stung by nonvenomous insect and other nonvenomous arthropods, initial encounter: Secondary | ICD-10-CM | POA: Insufficient documentation

## 2015-10-20 DIAGNOSIS — Z79899 Other long term (current) drug therapy: Secondary | ICD-10-CM | POA: Diagnosis not present

## 2015-10-20 DIAGNOSIS — M79662 Pain in left lower leg: Secondary | ICD-10-CM | POA: Diagnosis present

## 2015-10-20 MED ORDER — DOXYCYCLINE HYCLATE 100 MG PO CAPS: 100.0000 mg | ORAL_CAPSULE | Freq: Two times a day (BID) | ORAL | Status: AC

## 2015-10-20 NOTE — ED Notes (Signed)
Patient transported to Ultrasound 

## 2015-10-20 NOTE — ED Notes (Signed)
Pulses assessed usnig the doppler. Strong signal present to left DT and PT .

## 2015-10-20 NOTE — ED Notes (Addendum)
Pt given d/c instructions as per chart. Rx x 1. Verbalizes understanding. No questions. Pt had numerous questions and I attempted to answer them for him. Area marked for increased swelling and redness.

## 2015-10-20 NOTE — ED Notes (Signed)
Pt reports right calf pain since driving to [place] yesterday.  Noted to have a large red area to back of leg with warmth and pain.

## 2015-10-20 NOTE — ED Notes (Signed)
MD at bedside to answer questions.

## 2015-10-20 NOTE — Discharge Instructions (Signed)
Insect Bite There is no blood clot. Take the antibiotic as prescribed. Return to the ED if you develop worsening pain, spreading redness, fever, or any other concerns. Mosquitoes, flies, fleas, bedbugs, and many other insects can bite. Insect bites are different from insect stings. A sting is when poison (venom) is injected into the skin. Insect bites can cause pain or itching for a few days, but they are usually not serious. Some insects can spread diseases to people through a bite. SYMPTOMS  Symptoms of an insect bite include:  Itching or pain in the bite area.  Redness and swelling in the bite area.  An open wound (skin ulcer). In many cases, symptoms last for 2-4 days.  DIAGNOSIS  This condition is usually diagnosed based on symptoms and a physical exam. TREATMENT  Treatment is usually not needed for an insect bite. Symptoms often go away on their own. Your health care provider may recommend creams or lotions to help reduce itching. Antibiotic medicines may be prescribed if the bite becomes infected. A tetanus shot may be given in some cases. If you develop an allergic reaction to an insect bite, your health care provider will prescribe medicines to treat the reaction (antihistamines). This is rare. HOME CARE INSTRUCTIONS  Do not scratch the bite area.  Keep the bite area clean and dry. Wash the bite area daily with soap and water as told by your health care provider.  If directed, applyice to the bite area.  Put ice in a plastic bag.  Place a towel between your skin and the bag.  Leave the ice on for 20 minutes, 2-3 times per day.  To help reduce itching and swelling, try applying a baking soda paste, cortisone cream, or calamine lotion to the bite area as told by your health care provider.  Apply or take over-the-counter and prescription medicines only as told by your health care provider.  If you were prescribed an antibiotic medicine, use it as told by your health care  provider. Do not stop using the antibiotic even if your condition improves.  Keep all follow-up visits as told by your health care provider. This is important. PREVENTION   Use insect repellent. The best insect repellents contain:  DEET, picaridin, oil of lemon eucalyptus (OLE), or IR3535.  Higher amounts of an active ingredient.  When you are outdoors, wear clothing that covers your arms and legs.  Avoid opening windows that do not have window screens. SEEK MEDICAL CARE IF:  You have increased redness, swelling, or pain in the bite area.  You have a fever. SEEK IMMEDIATE MEDICAL CARE IF:   You have joint pain.   You have fluid, blood, or pus coming from the bite area.  You have a headache or neck pain.  You have unusual weakness.  You have a rash.  You have chest pain or shortness of breath.  You have abdominal pain, nausea, or vomiting.  You feel unusually tired or sleepy.   This information is not intended to replace advice given to you by your health care provider. Make sure you discuss any questions you have with your health care provider.   Document Released: 04/30/2004 Document Revised: 12/12/2014 Document Reviewed: 08/08/2014 Elsevier Interactive Patient Education [employer]2016 [employer]

## 2015-10-20 NOTE — ED Provider Notes (Signed)
CSN: [id number]     Arrival date & time 10/20/15  1302 History  By signing my name below, I, Levon Hedger, attest that this documentation has been prepared under the direction and in the presence of Glynn Octave, MD . Electronically Signed: Levon Hedger, Scribe. 10/20/2015. 3:45 PM.      Chief Complaint  Patient presents with  . Leg Pain   The history is provided by the patient. No language interpreter was used.   HPI Comments:  Noah Rice is a 40 y.o. male who presents to the Emergency Department complaining of sudden onset, burning, left calf pain onset yesterday at 1 pm. Pt states he drove to [facility] yesterday and went hiking, which he does every weekend. Per pt, pain began on the drive home after the hike.  He states this is the first time he has experienced these symptoms. He also complains of associated redness onset last night. Pt states he had an existing bite on his left ankle from several days ago, which did not bother him. Pt reports he was using DEET during his hike and is unsure of any new insect bites. He states pain worsened yesterday after bathing and massaging the area. Pt reports he was seen at his PCP today who directed him to the ED to r/o a blood clot. He denies fever, vomiting, CP, and SOB. He denies snake bite. No PMHx of diabetes  Past Medical History  Diagnosis Date  . Hypercholesteremia   . Asthma    Past Surgical History  Procedure Laterality Date  . Rhinoplasty    . Wrist fusion Left   . Ankle fracture surgery Left    History reviewed. No pertinent family history. Social History  Substance Use Topics  . Smoking status: Never Smoker   . Smokeless tobacco: Never Used  . Alcohol Use: Yes     Comment: weekly    Review of Systems 10 Systems reviewed and are negative for acute change except as noted in the HPI.  Allergies  Crab and Bee pollen  Home Medications   Prior to Admission medications   Medication Sig Start Date End Date Taking?  Authorizing Provider  amphetamine-dextroamphetamine (ADDERALL XR) 20 MG 24 hr capsule Take 20 mg by mouth daily.   Yes Historical Provider, MD  buPROPion (WELLBUTRIN SR) 200 MG 12 hr tablet Take 200 mg by mouth 2 (two) times daily.   Yes Historical Provider, MD  EPINEPHrine (EPIPEN) 0.3 mg/0.3 mL DEVI Inject 0.3 mg into the muscle once.    Historical Provider, MD  testosterone (ANDROGEL) 50 MG/5GM GEL Place 5 g onto the skin daily.    Historical Provider, MD   BP 140/86 mmHg  Pulse 81  Temp(Src) 98 F (36.7 C) (Oral)  Resp 18  Ht  (1.778 m)  Wt 230 lb (104.327 kg)  BMI 33.00 kg/m2  SpO2 98% Physical Exam  Constitutional: He is oriented to person, place, and time. He appears well-developed and well-nourished. No distress.  HENT:  Head: Normocephalic and atraumatic.  Mouth/Throat: Oropharynx is clear and moist. No oropharyngeal exudate.  Eyes: Conjunctivae and EOM are normal. Pupils are equal, round, and reactive to light.  Neck: Normal range of motion. Neck supple.  No meningismus.  Cardiovascular: Normal rate, regular rhythm, normal heart sounds and intact distal pulses.   No murmur heard. Intact DP and PT pulses  Pulmonary/Chest: Effort normal and breath sounds normal. No respiratory distress.  Abdominal: Soft. There is no tenderness. There is no rebound and no  guarding.  Musculoskeletal: Normal range of motion. He exhibits tenderness. He exhibits no edema.  Achilles intact   Neurological: He is alert and oriented to person, place, and time. No cranial nerve deficit. He exhibits normal muscle tone. Coordination normal.   5/5 strength throughout. CN 2-12 intact.Equal grip strength.   Skin: Skin is warm.  8x10 cm area of erythema to left posterior calf  Tenderness, no fluctuance  Psychiatric: He has a normal mood and affect. His behavior is normal.  Nursing note and vitals reviewed.   ED Course  Procedures  DIAGNOSTIC STUDIES:  Oxygen Saturation is 98% on RA, normal by  my interpretation.    COORDINATION OF CARE:  3:34 PM Will order US left lower leg. Discussed treatment plan with pt at bedside and pt agreed to plan. Labs Review Labs Reviewed - No data to display  Imaging Review No results found. I have personally reviewed and evaluated these images and lab results as part of my medical decision-making.   EKG Interpretation None      MDM   Final diagnoses:  Pain of left lower extremity  Insect bite  Burning, redness, pain to L calf after hiking yesterday. Does not recall specific bite sent by urgent carre to rule out DVT.  Suspect bug bite, possible cellulitis.  No abscess.  No bugs or ticks seen.  Obtain US to rule out DVT though clinically doubt.   Rx doxycyline to cover cellulitis and any possible tick borne illness.  DR. Juleen ChinaKohut to disposition after US result.    I personally performed the services described in this documentation, which was scribed in my presence. The recorded information has been reviewed and is accurate.     Glynn OctaveStephen Rainier Feuerborn, MD 10/20/15 [phone]

## 2018-06-27 ENCOUNTER — Ambulatory Visit: Payer: BLUE CROSS/BLUE SHIELD | Admitting: Psychiatry

## 2018-07-13 ENCOUNTER — Other Ambulatory Visit: Payer: Self-pay

## 2018-07-13 ENCOUNTER — Ambulatory Visit: Payer: BLUE CROSS/BLUE SHIELD | Admitting: Psychiatry

## 2019-01-10 ENCOUNTER — Other Ambulatory Visit: Payer: Self-pay

## 2019-01-10 DIAGNOSIS — Z20822 Contact with and (suspected) exposure to covid-19: Secondary | ICD-10-CM

## 2019-01-12 LAB — NOVEL CORONAVIRUS, NAA: SARS-CoV-2, NAA: NOT DETECTED

## 2020-01-25 ENCOUNTER — Encounter: Payer: Self-pay | Admitting: Psychiatry

## 2021-05-10 ENCOUNTER — Emergency Department (HOSPITAL_BASED_OUTPATIENT_CLINIC_OR_DEPARTMENT_OTHER): Payer: 59

## 2021-05-10 ENCOUNTER — Encounter (HOSPITAL_BASED_OUTPATIENT_CLINIC_OR_DEPARTMENT_OTHER): Payer: Self-pay | Admitting: Emergency Medicine

## 2021-05-10 ENCOUNTER — Emergency Department (HOSPITAL_BASED_OUTPATIENT_CLINIC_OR_DEPARTMENT_OTHER)
Admission: EM | Admit: 2021-05-10 | Discharge: 2021-05-10 | Disposition: A | Discharge status: Home / Self Care | Payer: 59 | Attending: Emergency Medicine | Admitting: Emergency Medicine

## 2021-05-10 DIAGNOSIS — W2102XA Struck by soccer ball, initial encounter: Secondary | ICD-10-CM | POA: Insufficient documentation

## 2021-05-10 DIAGNOSIS — Y9366 Activity, soccer: Secondary | ICD-10-CM | POA: Insufficient documentation

## 2021-05-10 DIAGNOSIS — S060X0A Concussion without loss of consciousness, initial encounter: Secondary | ICD-10-CM | POA: Diagnosis not present

## 2021-05-10 DIAGNOSIS — S0990XA Unspecified injury of head, initial encounter: Secondary | ICD-10-CM | POA: Diagnosis present

## 2021-05-10 MED ORDER — PROCHLORPERAZINE EDISYLATE 10 MG/2ML IJ SOLN
10.0000 mg | Freq: Once | INTRAMUSCULAR | Status: AC
  Administered 2021-05-10: 10 mg via INTRAVENOUS
  Filled 2021-05-10: qty 2

## 2021-05-10 MED ORDER — DIPHENHYDRAMINE HCL 50 MG/ML IJ SOLN
25.0000 mg | Freq: Once | INTRAMUSCULAR | Status: AC
Start: 2021-05-10 — End: 2021-05-10
  Administered 2021-05-10: 25 mg via INTRAVENOUS
  Filled 2021-05-10: qty 1

## 2021-05-10 NOTE — Discharge Instructions (Addendum)
Tylenol and ibuprofen as needed for pain.  Please return for worsening confusion vomiting.

## 2021-05-10 NOTE — ED Provider Notes (Signed)
[facility] EMERGENCY DEPT Provider Note   CSN: [id number] Arrival date & time: 05/10/21  1229     History  No chief complaint on file.   Noah Rice is a 46 y.o. male.  46 yo M with a chief complaints of closed head injury.  Patient is playing soccer and was struck in the head with a ball that he thinks was frozen.  He had had some confusion and vomiting with this.  And some trouble walking around.  He did have some alcohol just prior to this event as well.  Denies blood thinner use.       Home Medications Prior to Admission medications   Medication Sig Start Date End Date Taking? Authorizing Provider  amphetamine-dextroamphetamine (ADDERALL XR) 20 MG 24 hr capsule Take 20 mg by mouth daily.    [provider]  buPROPion (WELLBUTRIN SR) 200 MG 12 hr tablet Take 200 mg by mouth 2 (two) times daily.    [provider]  doxycycline (VIBRAMYCIN) 100 MG capsule Take 1 capsule (100 mg total) by mouth 2 (two) times daily. 10/20/15   Rancour, Jeannett Senior, MD  EPINEPHrine (EPIPEN) 0.3 mg/0.3 mL DEVI Inject 0.3 mg into the muscle once.    [provider]  testosterone (ANDROGEL) 50 MG/5GM GEL Place 5 g onto the skin daily.    [provider]      Allergies    Parke Simmers allergy] and Bee pollen    Review of Systems   Review of Systems  Physical Exam Updated Vital Signs BP (!) 158/75    Pulse 80    Temp 97.6 F (36.4 C) (Oral)    Resp 17    Ht 5\' 10"  (1.778 m)    Wt 117.9 kg    SpO2 97%    BMI 37.31 kg/m  Physical Exam Vitals and nursing note reviewed.  Constitutional:      Appearance: He is well-developed.  HENT:     Head: Normocephalic and atraumatic.  Eyes:     Pupils: Pupils are equal, round, and reactive to light.  Neck:     Vascular: No JVD.  Cardiovascular:     Rate and Rhythm: Normal rate and regular rhythm.     Heart sounds: No murmur heard.   No friction rub. No gallop.  Pulmonary:     Effort: No respiratory  distress.     Breath sounds: No wheezing.  Abdominal:     General: There is no distension.     Tenderness: There is no abdominal tenderness. There is no guarding or rebound.  Musculoskeletal:        General: Normal range of motion.     Cervical back: Normal range of motion and neck supple.  Skin:    Coloration: Skin is not pale.     Findings: No rash.  Neurological:     Mental Status: He is alert and oriented to person, place, and time.     Comments: Some wavering finger-to-nose.  Occurs bilaterally.  Psychiatric:        Behavior: Behavior normal.    ED Results / Procedures / Treatments   Labs (all labs ordered are listed, but only abnormal results are displayed) Labs Reviewed - No data to display  EKG None  Radiology CT Head Wo Contrast  Result Date: 05/10/2021 CLINICAL DATA:  Head injury, vomiting.  Flu last week. EXAM: CT HEAD WITHOUT CONTRAST TECHNIQUE: Contiguous axial images were obtained from the base of the skull through the vertex without  intravenous contrast. RADIATION DOSE REDUCTION: This exam was performed according to the departmental dose-optimization program which includes automated exposure control, adjustment of the mA and/or kV according to patient size and/or use of iterative reconstruction technique. COMPARISON:  None. FINDINGS: Brain: Ventricles are within normal limits in size and configuration. There is no mass, hemorrhage, edema or other evidence of acute parenchymal abnormality. No extra-axial hemorrhage. Vascular: No hyperdense vessel or unexpected calcification. Skull: Normal. Negative for fracture or focal lesion. Sinuses/Orbits: Fluid/mucosal thickening within the LEFT maxillary sinus. Remainder of the visualized paranasal sinuses are clear. Periorbital and retro-orbital soft tissues are unremarkable. Other: None. IMPRESSION: 1. No acute intracranial abnormality. No intracranial mass, hemorrhage or edema. No skull fracture. 2. LEFT maxillary sinus disease, of  uncertain chronicity. Electronically Signed   By: Bary Richard M.D.   On: 05/10/2021 14:49    Procedures Procedures    Medications Ordered in ED Medications  prochlorperazine (COMPAZINE) injection 10 mg (has no administration in time range)  diphenhydrAMINE (BENADRYL) injection 25 mg (has no administration in time range)    ED Course/ Medical Decision Making/ A&P                           Medical Decision Making Amount and/or Complexity of Data Reviewed Radiology: ordered.  Risk Prescription drug management.   46 yo M with a chief complaints of closed head injury.  Most likely a concussion by history and physical however the patient having multiple episodes of vomiting confusion and alcohol intake prior to the event will obtain a CT scan.  CT scan negative for acute intracranial pathology.  We will discharge the patient home.  Discussed concussion follow-up information.  3:06 PM:  I have discussed the diagnosis/risks/treatment options with the patient and family.  Evaluation and diagnostic testing in the emergency department does not suggest an emergent condition requiring admission or immediate intervention beyond what has been performed at this time.  They will follow up with  PCP. We also discussed returning to the ED immediately if new or worsening sx occur. We discussed the sx which are most concerning (e.g., sudden worsening pain, fever, inability to tolerate by mouth, confusion, vomiting) that necessitate immediate return. Medications administered to the patient during their visit and any new prescriptions provided to the patient are listed below.  Medications given during this visit Medications  prochlorperazine (COMPAZINE) injection 10 mg (has no administration in time range)  diphenhydrAMINE (BENADRYL) injection 25 mg (has no administration in time range)     The patient appears reasonably screen and/or stabilized for discharge and I doubt any other medical condition or  other [facility] requiring further screening, evaluation, or treatment in the ED at this time prior to discharge.          Final Clinical Impression(s) / ED Diagnoses Final diagnoses:  Concussion without loss of consciousness, initial encounter    Rx / DC Orders ED Discharge Orders     None         Melene Plan, DO 05/10/21 1506

## 2021-05-10 NOTE — ED Triage Notes (Signed)
Pt was playing soccer and headed the ball around 11. Pt believes the ball was frozen and hit to the right temple side. Pt threw up. Also had flu last week and worried about PNA. Denies LOC, just lethargic.

## 2023-12-01 IMAGING — CT CT HEAD W/O CM
4 series · 15 of 47 positions shown, 17 images · non-contrast
Comparison: None.

CLINICAL DATA: Head injury, vomiting.  Flu last week.



[Series 2: head wo · axial · 0.47mm/px · z∈[-191,-71]mm · 7 of 33 slices shown, 9 images]
[im 5/33  brain]
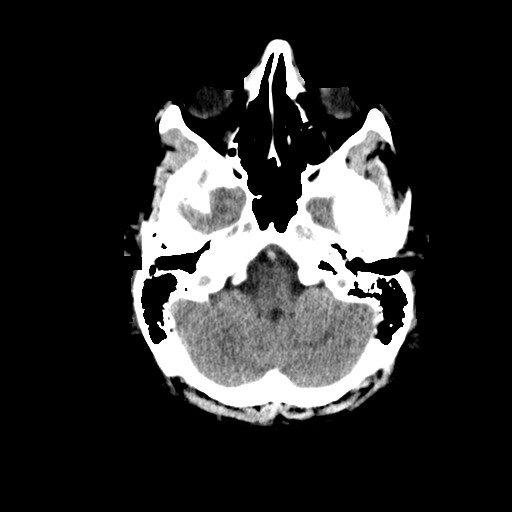
[im 5/33  bone]
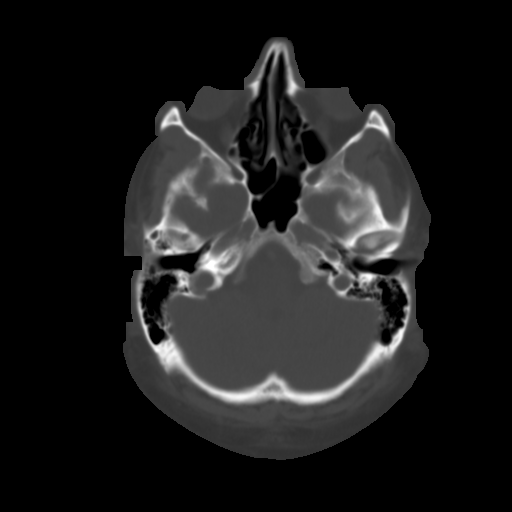
[im 9/33  brain]
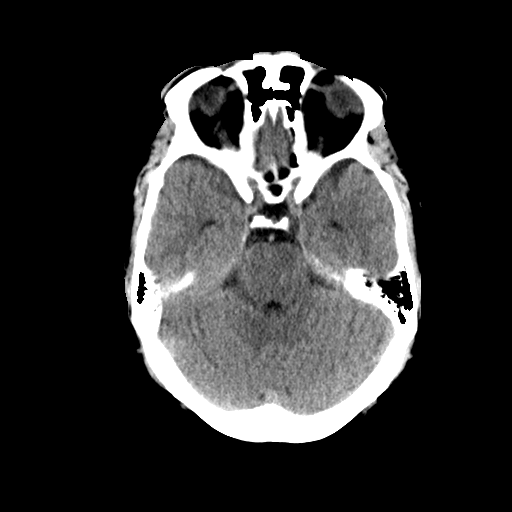
[im 13/33  brain]
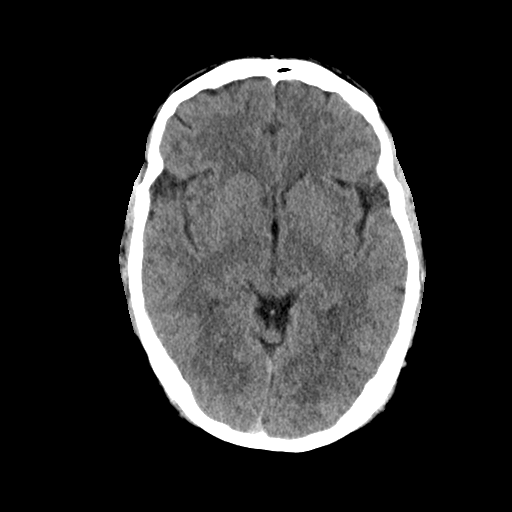
[im 17/33  brain]
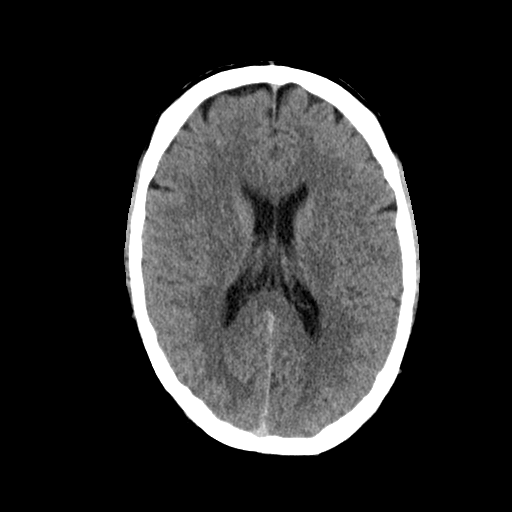
[im 21/33  brain]
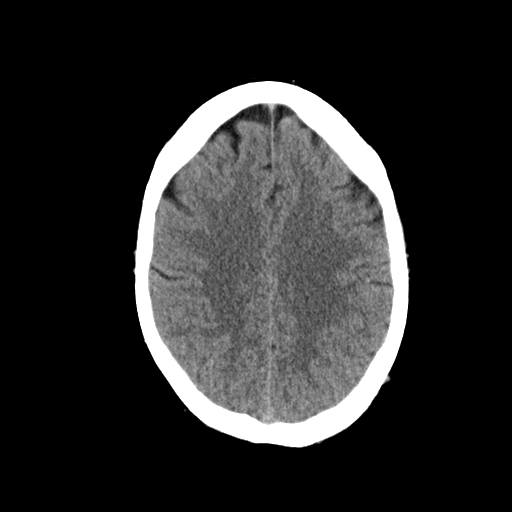
[im 21/33  bone]
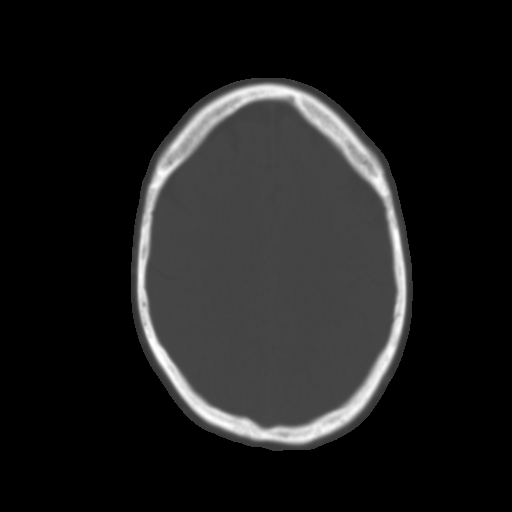
[im 25/33  brain]
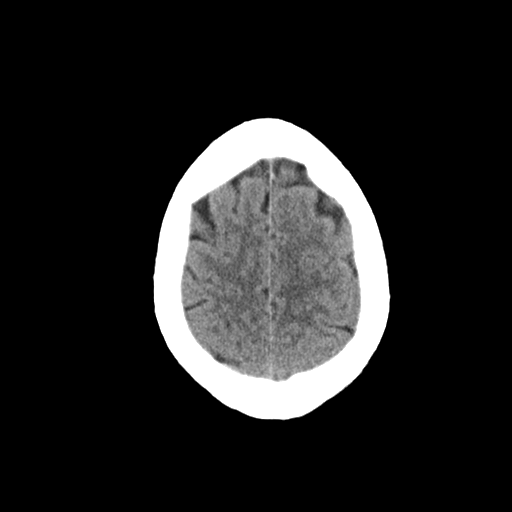
[im 29/33  brain]
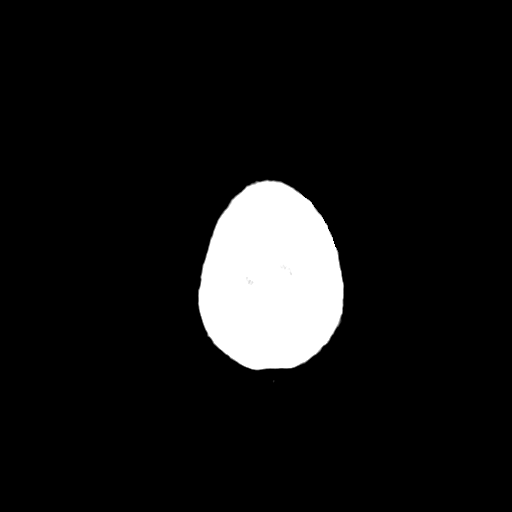

[Series 3: head bone · axial · 0.47mm/px · z∈[-195,-179]mm · 2 of 81 slices shown]
[im 9/81  bone]
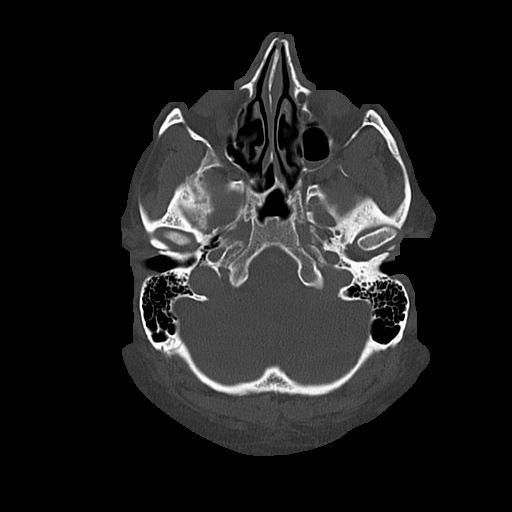
[im 17/81  bone]
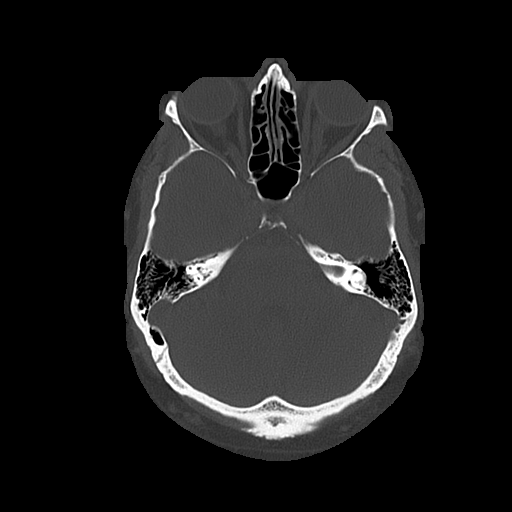

[Series 4: coronal soft · coronal · 0.33mm/px · 3 of 72 slices shown]
[im 24/72  brain]
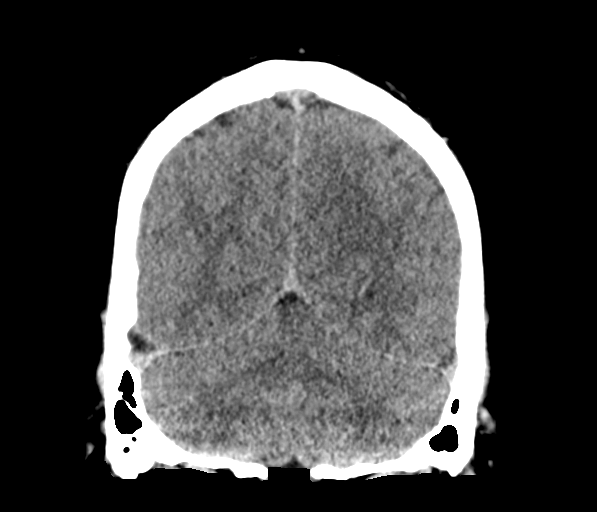
[im 32/72  brain]
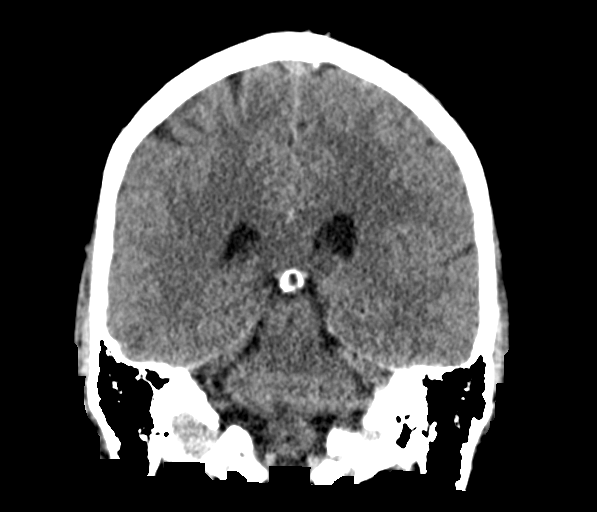
[im 40/72  brain]
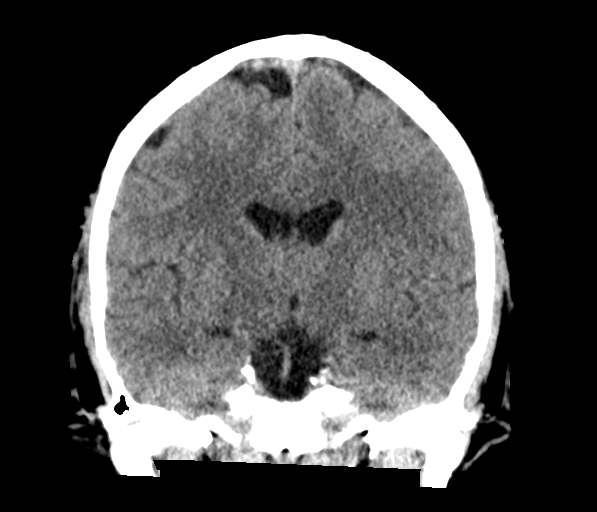

[Series 5: sagittal soft · sagittal · 0.32mm/px · 3 of 62 slices shown]
[im 21/62  brain]
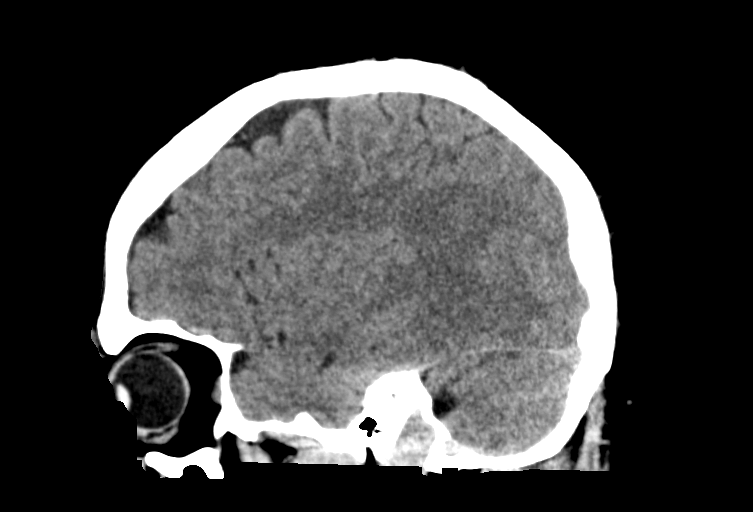
[im 31/62  brain]
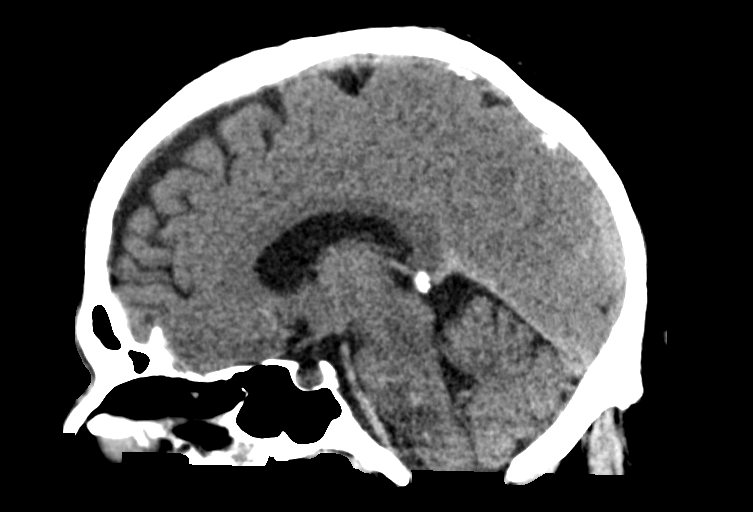
[im 41/62  brain]
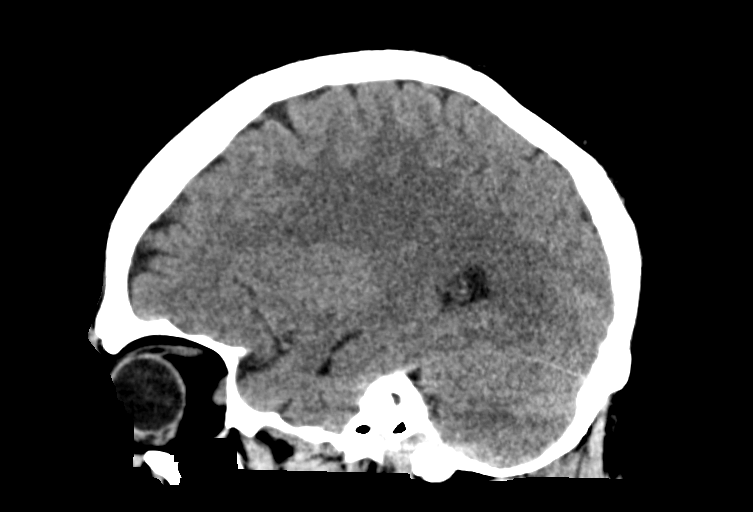

[15 of 47 positions shown; findings below may reference images not displayed]

FINDINGS: Brain: Ventricles are within normal limits in size and
configuration. There is no mass, hemorrhage, edema or other evidence
of acute parenchymal abnormality. No extra-axial hemorrhage.

Vascular: No hyperdense vessel or unexpected calcification.

Skull: Normal. Negative for fracture or focal lesion.

Sinuses/Orbits: Fluid/mucosal thickening within the LEFT maxillary
sinus. Remainder of the visualized paranasal sinuses are clear.
Periorbital and retro-orbital soft tissues are unremarkable.

Other: None.
IMPRESSION: 1. No acute intracranial abnormality. No intracranial mass,
hemorrhage or edema. No skull fracture.
2. LEFT maxillary sinus disease, of uncertain chronicity.
